# Patient Record
Sex: Female | Born: 1990 | Race: Black or African American | Marital: Single | Smoking: Never smoker
Health system: Southern US, Community
[De-identification: ages and names within clinical notes are randomized; demographics above are authoritative.]

---

## 2012-05-28 ENCOUNTER — Encounter: Payer: Self-pay | Admitting: Obstetrics and Gynecology

## 2012-05-28 ENCOUNTER — Ambulatory Visit: Payer: No Typology Code available for payment source | Admitting: Obstetrics and Gynecology

## 2012-05-28 VITALS — BP 94/60 | HR 60 | Wt 110.0 lb

## 2012-05-28 DIAGNOSIS — Z113 Encounter for screening for infections with a predominantly sexual mode of transmission: Secondary | ICD-10-CM

## 2012-05-28 DIAGNOSIS — Z01419 Encounter for gynecological examination (general) (routine) without abnormal findings: Secondary | ICD-10-CM

## 2012-05-28 NOTE — Progress Notes (Signed)
Regular Periods: no Mammogram: no  Monthly Breast Ex.: yes Exercise: yes  Tetanus < 10 years: yes Seatbelts: yes  NI. Bladder Functn.: yes Abuse at home: no  Daily BM's: yes Stressful Work: no  Healthy Diet: no Sigmoid-Colonoscopy: NO  Calcium: no Medical problems this year: DISCUSS BIRTH CONTROL   LAST ZOX:WRUEA  Contraception: NONE  Mammogram:  NO  PCP: NO  PMH: NO CHANGE  FMH: NO CHANGE  Last Bone Scan: NO  PT IS SINGLE

## 2012-05-28 NOTE — Progress Notes (Signed)
Subjective:    Amber Perkins is a 22 y.o. female, G0P0, who presents for an annual exam. The patient with questions about contraception.  Handout given on contraceptive methods and reviewed. Patient most interested in Nexplanon.  Reviewed effectiveness, duration of action, side effects, risks and benefits.  Patient has used Depo Provera for the past 2 years.  Menstrual cycle:   LMP: No LMP recorded. Patient has had an injection.  Last injection was due May 04, 2012             Review of Systems Pertinent items are noted in HPI. Denies pelvic pain, urinary tract symptoms, vaginitis symptoms, irregular bleeding, menopausal symptoms, change in bowel habits or rectal bleeding   Objective:    BP 94/60  Pulse 60  Wt 110 lb (49.896 kg)   Wt Readings from Last 1 Encounters:  05/28/12 110 lb (49.896 kg) (15.14%*)   * Growth percentiles are based on CDC 2-20 Years data.   There is no height on file to calculate BMI. General Appearance: Alert, no acute distress HEENT: Grossly normal Neck / Thyroid: Supple, no thyromegaly or cervical adenopathy Lungs: Clear to auscultation bilaterally Back: No CVA tenderness Breast Exam: No masses or nodes.No dimpling, nipple retraction or discharge. Cardiovascular: Regular rate and rhythm.  Gastrointestinal: Soft, non-tender, no masses or organomegaly Pelvic Exam: EGBUS-wnl, vagina-normal rugae, cervix- without lesions or tenderness, uterus appears normal size shape and consistency, adnexae-no masses or tenderness Rectovaginal: no masses and normal sphincter tone Lymphatic Exam: Non-palpable nodes in neck, clavicular,  axillary, or inguinal regions  Skin: no rashes or abnormalities Extremities: no clubbing cyanosis or edema  Neurologic: grossly normal Psychiatric: Alert and oriented     Assessment:   Routine GYN Exam   Plan:  STD testing  Advised to use condoms until she decides about contraception  Patient to call if she wants  Nexplanon Nexplanon brochure  RTO 1 year or prn  Zyden Suman,ELMIRAPA-C

## 2012-05-29 LAB — HIV ANTIBODY (ROUTINE TESTING W REFLEX): HIV: NONREACTIVE

## 2012-05-29 LAB — GC/CHLAMYDIA PROBE AMP
CT Probe RNA: NEGATIVE
GC Probe RNA: NEGATIVE

## 2012-05-31 LAB — HSV 1 ANTIBODY, IGG: HSV 1 Glycoprotein G Ab, IgG: 0.47 IV

## 2012-06-10 ENCOUNTER — Telehealth: Payer: Self-pay | Admitting: Obstetrics and Gynecology

## 2012-06-10 NOTE — Telephone Encounter (Signed)
Spoke with pt rgd msg informed labs wnl pt voice understanding 

## 2017-09-22 ENCOUNTER — Ambulatory Visit (HOSPITAL_COMMUNITY)
Admission: EM | Admit: 2017-09-22 | Discharge: 2017-09-22 | Disposition: A | Discharge status: Home / Self Care | Payer: Self-pay | Attending: Physician Assistant | Admitting: Physician Assistant

## 2017-09-22 ENCOUNTER — Encounter (HOSPITAL_COMMUNITY): Payer: Self-pay | Admitting: Emergency Medicine

## 2017-09-22 DIAGNOSIS — J020 Streptococcal pharyngitis: Secondary | ICD-10-CM

## 2017-09-22 DIAGNOSIS — B950 Streptococcus, group A, as the cause of diseases classified elsewhere: Secondary | ICD-10-CM

## 2017-09-22 LAB — POCT RAPID STREP A: Streptococcus, Group A Screen (Direct): POSITIVE — AB

## 2017-09-22 MED ORDER — PENICILLIN G BENZATHINE 1200000 UNIT/2ML IM SUSP
INTRAMUSCULAR | Status: AC
  Filled 2017-09-22: qty 2

## 2017-09-22 MED ORDER — PENICILLIN G BENZATHINE 1200000 UNIT/2ML IM SUSP
1.2000 10*6.[IU] | Freq: Once | INTRAMUSCULAR | Status: AC
  Administered 2017-09-22: 1.2 10*6.[IU] via INTRAMUSCULAR

## 2017-09-22 NOTE — ED Provider Notes (Signed)
09/22/2017 12:30 PM   DOB: 25-Jan-1991 / MRN: [id number]  SUBJECTIVE:  Amber Perkins is a 27 y.o. female presenting for sore throat x3 days.  Patient tells me that her niece has had strep throat.  She complains of a low-grade fever and fatigue.  Denies diaphoresis, shortness of breath, chest pain.  She is not sexually active at this time.  She has No Known Allergies.   She  has no past medical history on file.    She  reports that she has never smoked. She has never used smokeless tobacco. She reports that she drinks alcohol. She reports that she does not use drugs. She  reports that she currently engages in sexual activity. She reports using the following method of birth control/protection: Other-see comments. The patient  has no past surgical history on file.  Her family history is not on file.  ROS per HPI  OBJECTIVE:  BP 112/77   Pulse 95   Temp 99.3 F (37.4 C) (Oral)   Resp 16   Wt 130 lb (59 kg)   LMP 09/19/2017   SpO2 97%   Wt Readings from Last 3 Encounters:  09/22/17 130 lb (59 kg)  05/28/12 110 lb (49.9 kg) (15 %, Z= -1.03)*   * Growth percentiles are based on CDC (Girls, 2-20 Years) data.   Temp Readings from Last 3 Encounters:  09/22/17 99.3 F (37.4 C) (Oral)   BP Readings from Last 3 Encounters:  09/22/17 112/77  05/28/12 94/60   Pulse Readings from Last 3 Encounters:  09/22/17 95  05/28/12 60    Physical Exam  Constitutional: She is oriented to person, place, and time. She appears well-nourished.  Non-toxic appearance. She does not appear ill. No distress.  HENT:  Mouth/Throat: Uvula is midline. No oral lesions. No uvula swelling. Oropharyngeal exudate and posterior oropharyngeal erythema present. No posterior oropharyngeal edema or tonsillar abscesses. Tonsils are 2+ on the right. Tonsils are 2+ on the left. Tonsillar exudate.  Eyes: Pupils are equal, round, and reactive to light. EOM are normal.  Cardiovascular: Normal rate, regular rhythm, S1  normal, S2 normal, normal heart sounds and intact distal pulses. Exam reveals no gallop, no friction rub and no decreased pulses.  No murmur heard. Pulmonary/Chest: Effort normal. No stridor. No respiratory distress. She has no wheezes. She has no rales.  Abdominal: She exhibits no distension.  Musculoskeletal: She exhibits no edema.  Neurological: She is alert and oriented to person, place, and time. No cranial nerve deficit. Gait normal.  Skin: Skin is dry. She is not diaphoretic.  Psychiatric: She has a normal mood and affect.  Vitals reviewed.   No results found for this or any previous visit (from the past 72 hour(s)).  No results found.  ASSESSMENT AND PLAN:   Group A streptococcal infection IM Bicillin here.  Advised pain control at home.  RTC as needed.    Discharge Instructions     I am glad you came in today.  When you get home please control your pain with taking Tylenol and ibuprofen over-the-counter.  You can take up to 1000 mg Tylenol every 8 hours as needed along with 600 mg of ibuprofen every 6-8 hours as needed.        The patient is advised to call or return to clinic if she does not see an improvement in symptoms, or to seek the care of the closest emergency department if she worsens with the above plan.   Deliah Boston, MHS, PA-C  09/22/2017 12:30 PM   Ofilia Neaslark, Dajah Fischman L, PA-C 09/22/17 1230

## 2017-09-22 NOTE — Discharge Instructions (Addendum)
I am glad you came in today.  When you get home please control your pain with taking Tylenol and ibuprofen over-the-counter.  You can take up to 1000 mg Tylenol every 8 hours as needed along with 600 mg of ibuprofen every 6-8 hours as needed.

## 2017-09-22 NOTE — ED Triage Notes (Signed)
PT reports sore throat since Friday. Low grade temp in triage.

## 2018-05-31 DIAGNOSIS — Z01419 Encounter for gynecological examination (general) (routine) without abnormal findings: Secondary | ICD-10-CM | POA: Diagnosis not present

## 2018-05-31 DIAGNOSIS — Z118 Encounter for screening for other infectious and parasitic diseases: Secondary | ICD-10-CM | POA: Diagnosis not present

## 2018-05-31 DIAGNOSIS — Z6826 Body mass index (BMI) 26.0-26.9, adult: Secondary | ICD-10-CM | POA: Diagnosis not present

## 2018-12-13 ENCOUNTER — Encounter (HOSPITAL_COMMUNITY): Payer: Self-pay

## 2018-12-13 ENCOUNTER — Other Ambulatory Visit: Payer: Self-pay

## 2018-12-13 ENCOUNTER — Ambulatory Visit (HOSPITAL_COMMUNITY)
Admission: EM | Admit: 2018-12-13 | Discharge: 2018-12-13 | Disposition: A | Discharge status: Home / Self Care | Payer: BC Managed Care – PPO | Attending: Emergency Medicine | Admitting: Emergency Medicine

## 2018-12-13 DIAGNOSIS — Z20828 Contact with and (suspected) exposure to other viral communicable diseases: Secondary | ICD-10-CM | POA: Insufficient documentation

## 2018-12-13 DIAGNOSIS — R6889 Other general symptoms and signs: Secondary | ICD-10-CM | POA: Diagnosis present

## 2018-12-13 DIAGNOSIS — R1111 Vomiting without nausea: Secondary | ICD-10-CM | POA: Insufficient documentation

## 2018-12-13 DIAGNOSIS — Z20822 Contact with and (suspected) exposure to covid-19: Secondary | ICD-10-CM

## 2018-12-13 NOTE — Discharge Instructions (Addendum)
Your COVID test is pending.  You should self quarantine until your test result is back and is negative.   ° °Go to the emergency department if you develop shortness of breath, high fever, severe diarrhea, or other concerning symptoms.   ° ° ° °

## 2018-12-13 NOTE — ED Triage Notes (Signed)
Pt states she needs a Covid test to return back to work. Pt states she was sent home from work because she vomited at work.

## 2018-12-13 NOTE — ED Provider Notes (Signed)
[facility]    CSN: [id number] Arrival date & time: 12/13/18  1533      History   Chief Complaint Chief Complaint  Patient presents with  . covid test    HPI Amber Perkins is a 28 y.o. female.   Patient presents with request for COVID test.  She vomited one time while at work on 12/11/2018 and was sent home with instructions to have a COVID test with negative result before she could come back to work.  She states she has not vomited since that one episode.  She denies fever, chills, sore throat, congestion, rhinorrhea, cough, shortness of breath, abdominal pain, diarrhea, rash, or other symptoms.  LMP: 2 weeks.`  The history is provided by the patient.    History reviewed. No pertinent past medical history.  There are no active problems to display for this patient.   History reviewed. No pertinent surgical history.  OB History    Gravida  0   Para      Term      Preterm      AB      Living        SAB      TAB      Ectopic      Multiple      Live Births               Home Medications    Prior to Admission medications   Not on File    Family History History reviewed. No pertinent family history.  Social History Social History   Tobacco Use  . Smoking status: Never Smoker  . Smokeless tobacco: Never Used  Substance Use Topics  . Alcohol use: Yes  . Drug use: No     Allergies   Patient has no known allergies.   Review of Systems Review of Systems  Constitutional: Negative for chills and fever.  HENT: Negative for ear pain and sore throat.   Eyes: Negative for pain and visual disturbance.  Respiratory: Negative for cough and shortness of breath.   Cardiovascular: Negative for chest pain and palpitations.  Gastrointestinal: Positive for vomiting. Negative for abdominal pain, diarrhea and nausea.  Genitourinary: Negative for dysuria and hematuria.  Musculoskeletal: Negative for arthralgias and back pain.  Skin:  Negative for color change and rash.  Neurological: Negative for seizures and syncope.  All other systems reviewed and are negative.    Physical Exam Triage Vital Signs ED Triage Vitals  Enc Vitals Group     BP 12/13/18 1625 118/65     Pulse Rate 12/13/18 1625 81     Resp 12/13/18 1625 18     Temp 12/13/18 1625 99.3 F (37.4 C)     Temp Source 12/13/18 1625 Oral     SpO2 12/13/18 1625 100 %     Weight 12/13/18 1626 125 lb (56.7 kg)     Height --      Head Circumference --      Peak Flow --      Pain Score 12/13/18 1626 6     Pain Loc --      Pain Edu? --      Excl. in GC? --    No data found.  Updated Vital Signs BP 118/65 (BP Location: Right Arm)   Pulse 81   Temp 99.3 F (37.4 C) (Oral)   Resp 18   Wt 125 lb (56.7 kg)   SpO2 100%   Visual Acuity Right Eye  Distance:   Left Eye Distance:   Bilateral Distance:    Right Eye Near:   Left Eye Near:    Bilateral Near:     Physical Exam Vitals signs and nursing note reviewed.  Constitutional:      General: She is not in acute distress.    Appearance: She is well-developed.  HENT:     Head: Normocephalic and atraumatic.     Right Ear: Tympanic membrane normal.     Left Ear: Tympanic membrane normal.     Nose: Nose normal.     Mouth/Throat:     Mouth: Mucous membranes are moist.     Pharynx: Oropharynx is clear.  Eyes:     Conjunctiva/sclera: Conjunctivae normal.  Neck:     Musculoskeletal: Neck supple.  Cardiovascular:     Rate and Rhythm: Normal rate and regular rhythm.     Heart sounds: No murmur.  Pulmonary:     Effort: Pulmonary effort is normal. No respiratory distress.     Breath sounds: Normal breath sounds.  Abdominal:     General: Bowel sounds are normal.     Palpations: Abdomen is soft.     Tenderness: There is no abdominal tenderness. There is no right CVA tenderness, left CVA tenderness, guarding or rebound.  Skin:    General: Skin is warm and dry.     Findings: No rash.  Neurological:      General: No focal deficit present.     Mental Status: She is alert and oriented to person, place, and time.      UC Treatments / Results  Labs (all labs ordered are listed, but only abnormal results are displayed) Labs Reviewed  NOVEL CORONAVIRUS, NAA (HOSP ORDER, SEND-OUT TO REF LAB; TAT 18-24 HRS)    EKG   Radiology No results found.  Procedures Procedures (including critical care time)  Medications Ordered in UC Medications - No data to display  Initial Impression / Assessment and Plan / UC Course  I have reviewed the triage vital signs and the nursing notes.  Pertinent labs & imaging results that were available during my care of the patient were reviewed by me and considered in my medical decision making (see chart for details).    Non-intractable vomiting without nausea; Suspect COVID.  COVID test performed here.  Instructed patient to self quarantine until her test result is back.  Instructed her to go to the emergency department if she develops high fever, shortness of breath, severe diarrhea, or other concerning symptoms.     Final Clinical Impressions(s) / UC Diagnoses   Final diagnoses:  Non-intractable vomiting without nausea, unspecified vomiting type  Suspected Covid-19 Virus Infection     Discharge Instructions     Your COVID test is pending.  You should self quarantine until your test result is back and is negative.    Go to the emergency department if you develop shortness of breath, high fever, severe diarrhea, or other concerning symptoms.         ED Prescriptions    None     Controlled Substance Prescriptions [place] Controlled Substance Registry consulted? Not Applicable   Sharion Balloon, NP 12/13/18 1718

## 2018-12-15 LAB — NOVEL CORONAVIRUS, NAA (HOSP ORDER, SEND-OUT TO REF LAB; TAT 18-24 HRS): SARS-CoV-2, NAA: NOT DETECTED

## 2020-01-01 ENCOUNTER — Emergency Department (HOSPITAL_COMMUNITY)
Admission: EM | Admit: 2020-01-01 | Discharge: 2020-01-01 | Disposition: A | Discharge status: Home / Self Care | Payer: BC Managed Care – PPO | Attending: Emergency Medicine | Admitting: Emergency Medicine

## 2020-01-01 ENCOUNTER — Emergency Department (HOSPITAL_COMMUNITY): Payer: BC Managed Care – PPO

## 2020-01-01 ENCOUNTER — Encounter (HOSPITAL_COMMUNITY): Payer: Self-pay | Admitting: Emergency Medicine

## 2020-01-01 ENCOUNTER — Other Ambulatory Visit: Payer: Self-pay

## 2020-01-01 DIAGNOSIS — Z5321 Procedure and treatment not carried out due to patient leaving prior to being seen by health care provider: Secondary | ICD-10-CM | POA: Insufficient documentation

## 2020-01-01 DIAGNOSIS — U071 COVID-19: Secondary | ICD-10-CM | POA: Insufficient documentation

## 2020-01-01 DIAGNOSIS — R05 Cough: Secondary | ICD-10-CM | POA: Insufficient documentation

## 2020-01-01 DIAGNOSIS — R509 Fever, unspecified: Secondary | ICD-10-CM | POA: Diagnosis present

## 2020-01-01 LAB — SARS CORONAVIRUS 2 BY RT PCR (HOSPITAL ORDER, PERFORMED IN ~~LOC~~ HOSPITAL LAB): SARS Coronavirus 2: POSITIVE — AB

## 2020-01-01 NOTE — ED Triage Notes (Signed)
Patient here from home reporting fever and cough x2 days. Non vaccinated.

## 2020-01-02 ENCOUNTER — Telehealth (HOSPITAL_COMMUNITY): Payer: Self-pay | Admitting: Adult Health

## 2020-01-02 NOTE — Telephone Encounter (Signed)
Called and LMOM regarding monoclonal antibody treatment for COVID 19 given to those who are at risk for complications and/or hospitalization of the virus.  Patient meets criteria based on: social risk  Call back number given: [phone]  My chart message: sent  Dontravious Camille, NP  

## 2020-01-03 ENCOUNTER — Telehealth: Payer: Self-pay | Admitting: Oncology

## 2020-01-03 NOTE — Telephone Encounter (Signed)
Called to Discuss with patient about Covid symptoms and the use of regeneron, a monoclonal antibody infusion for those with mild to moderate Covid symptoms and at a high risk of hospitalization.     Pt is qualified for this infusion at the [facility] infusion center due to co-morbid conditions and/or a member of an at-risk group.    No past medical history on file.   Unable to reach pt. Left message to return call  Mignon Pine, AGNP-C [phone] [facility])

## 2021-11-14 IMAGING — CR DG CHEST 2V
2 series · 2 of 2 positions shown · non-contrast
Comparison: None.

CLINICAL DATA: Cough

EXAM:
CHEST - 2 VIEW

[w chest pa]
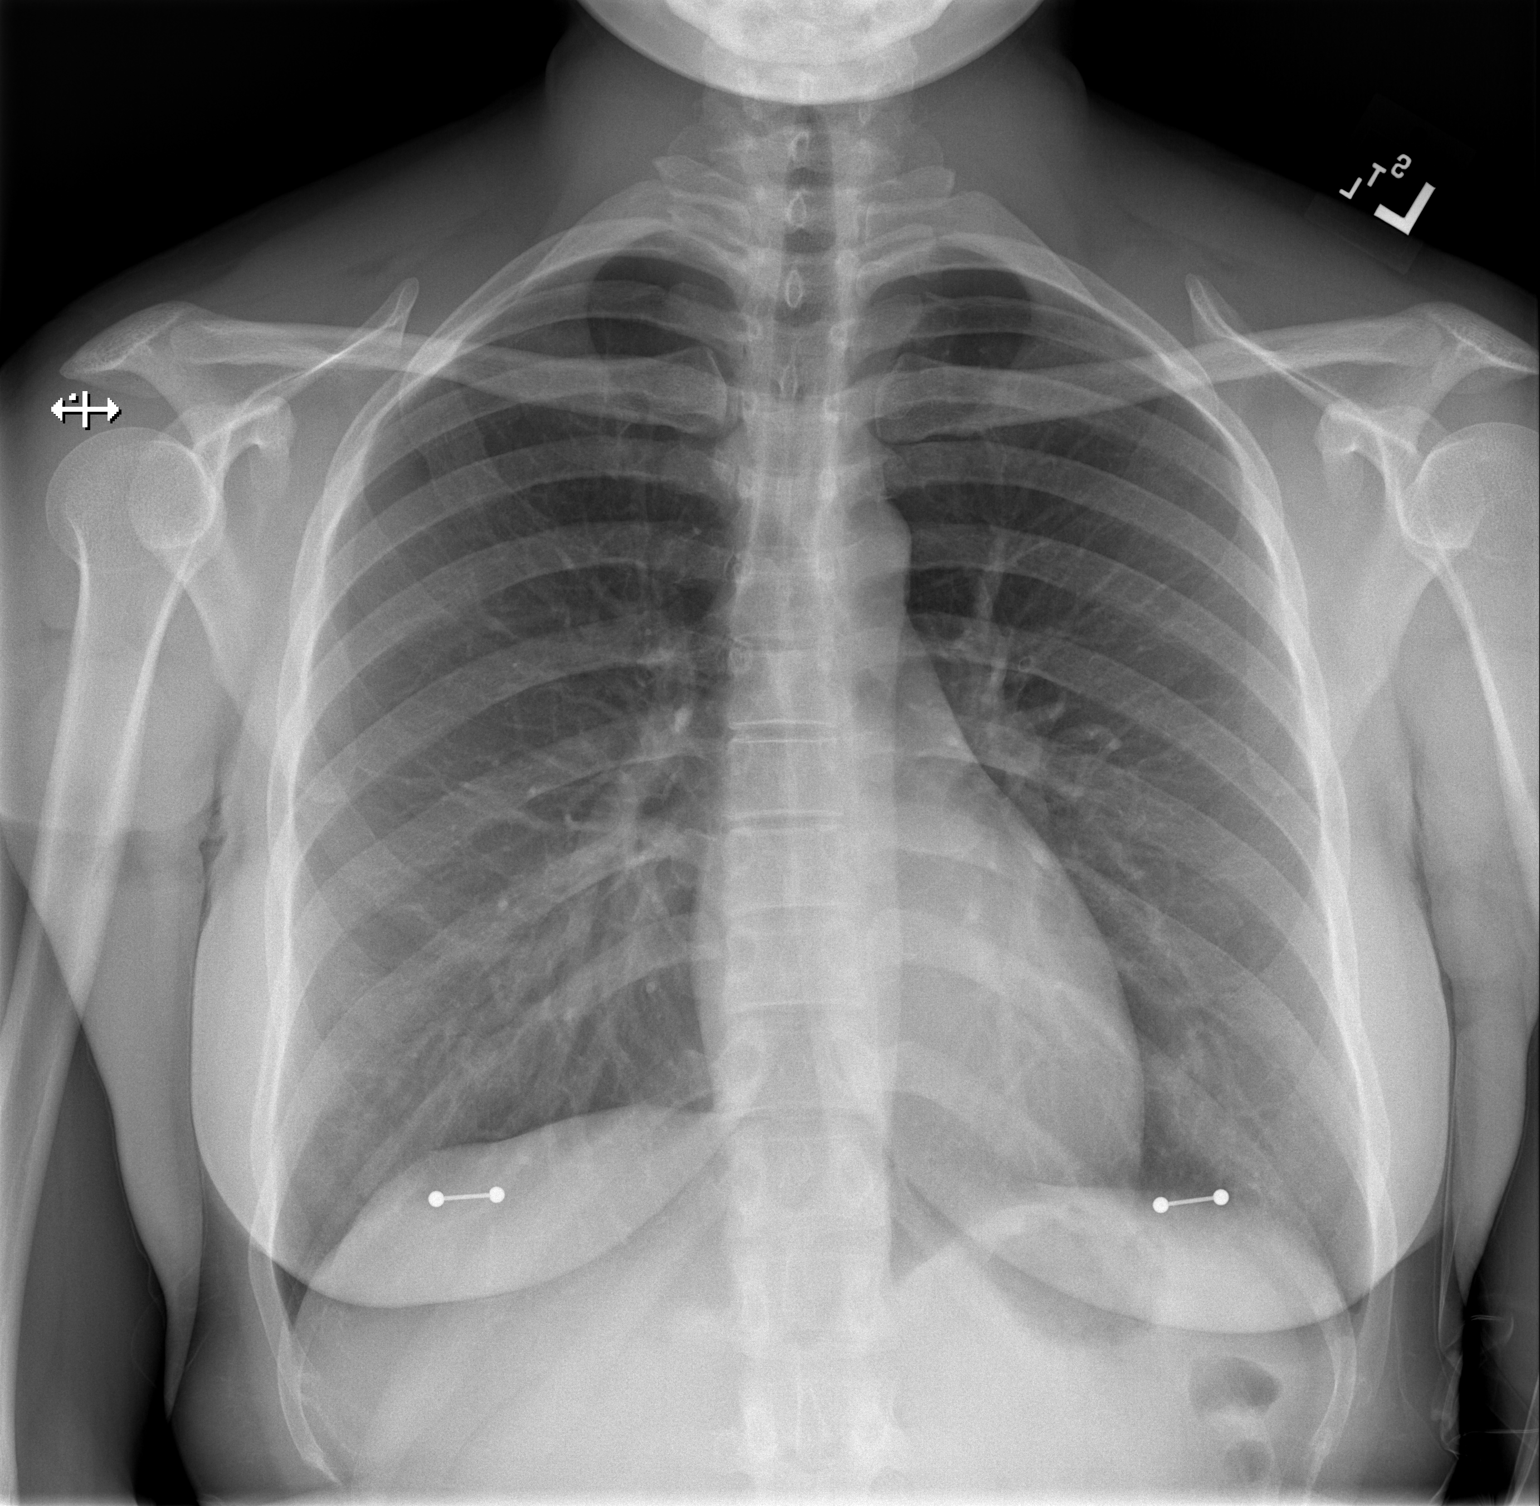

[w chest lat]
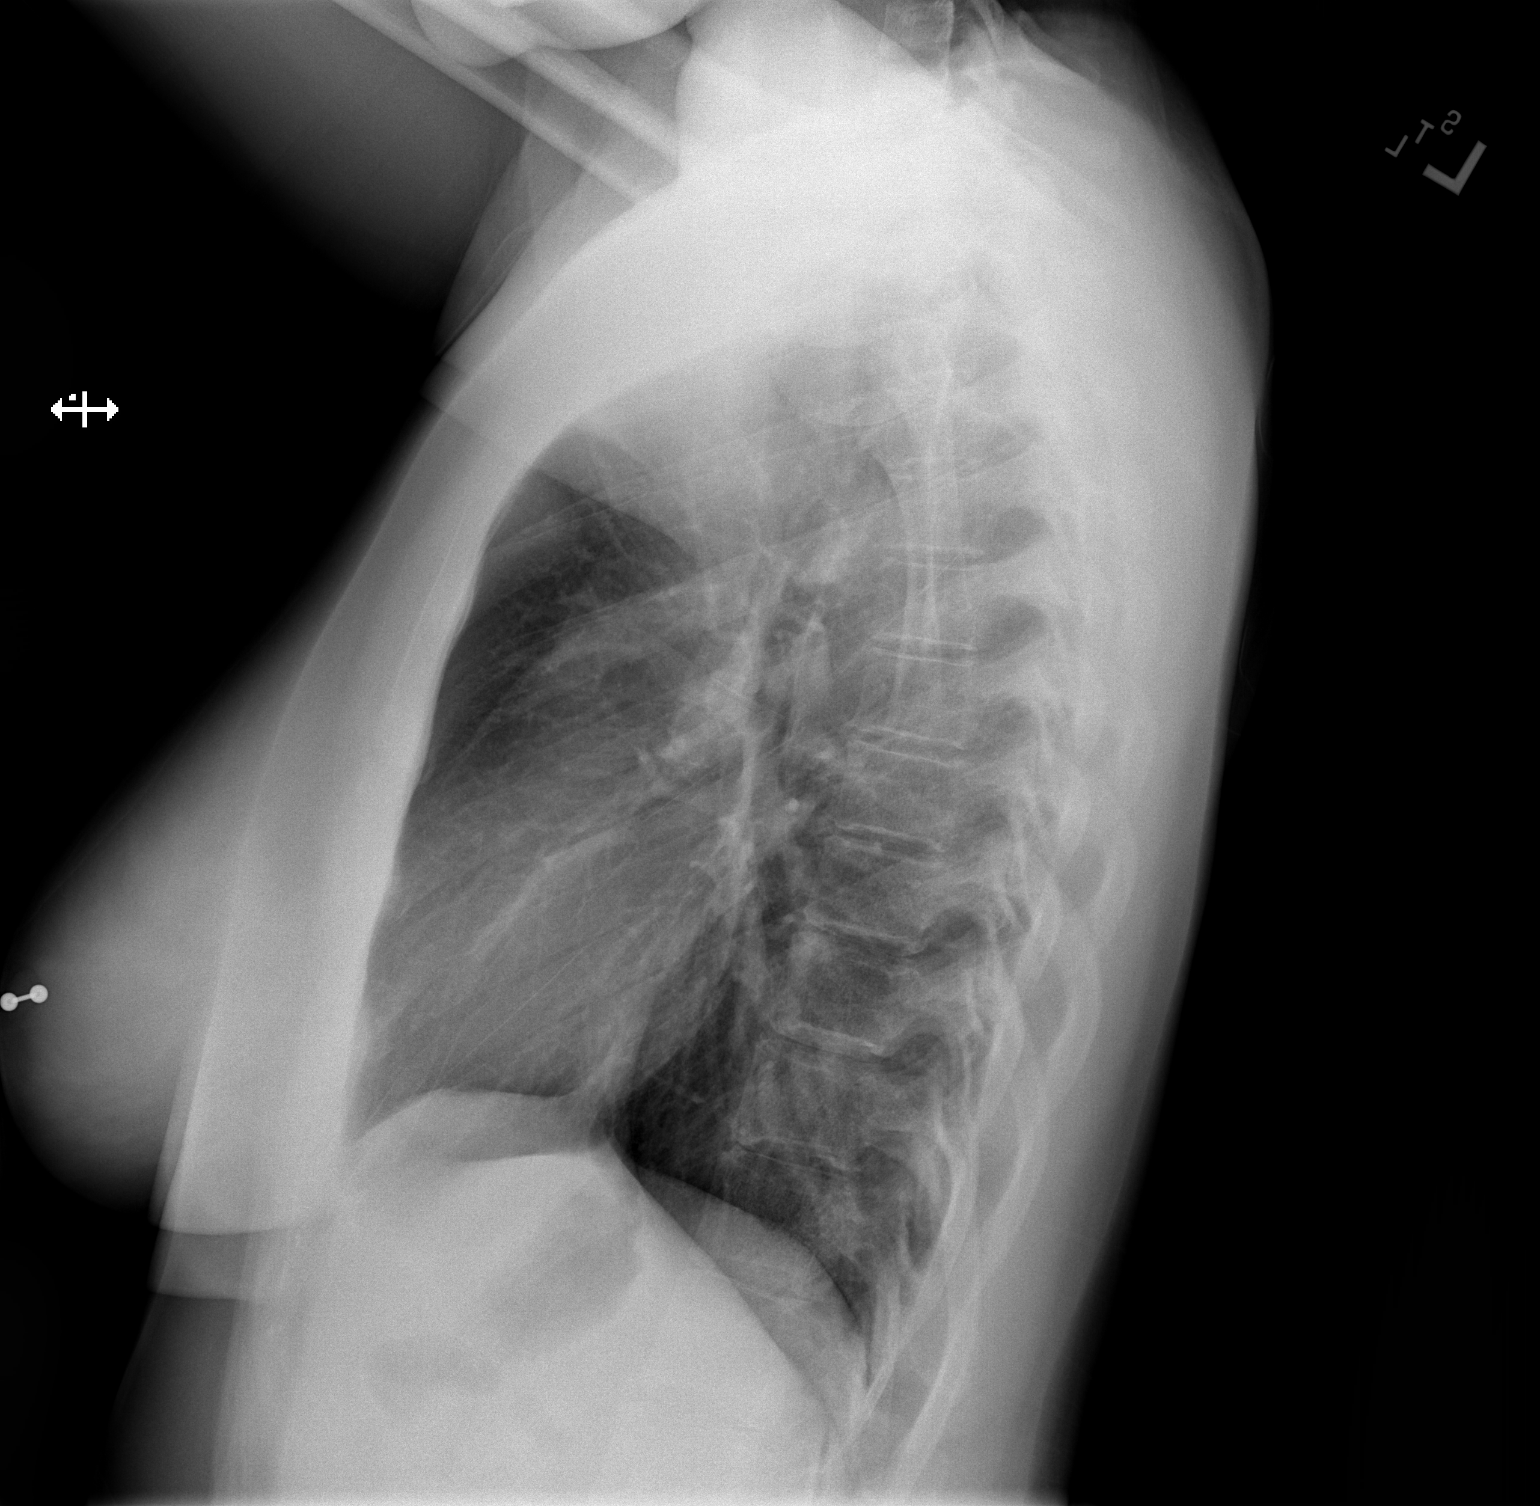

[2 of 2 positions shown; findings below may reference images not displayed]

FINDINGS: The heart size and mediastinal contours are within normal limits.
Both lungs are clear. The visualized skeletal structures are
unremarkable.
IMPRESSION: No active cardiopulmonary disease.
